# Patient Record
Sex: Female | Born: 1975 | Race: Black or African American | Hispanic: No | Marital: Single | Smoking: Current every day smoker
Health system: Southern US, Community
[De-identification: ages and names within clinical notes are randomized; demographics above are authoritative.]

## PROBLEM LIST (undated history)

## (undated) DIAGNOSIS — Z789 Other specified health status: Secondary | ICD-10-CM

## (undated) DIAGNOSIS — Z720 Tobacco use: Secondary | ICD-10-CM

## (undated) DIAGNOSIS — Z6833 Body mass index (BMI) 33.0-33.9, adult: Secondary | ICD-10-CM

## (undated) HISTORY — DX: Tobacco use: Z72.0

## (undated) HISTORY — PX: NO PAST SURGERIES: SHX2092

## (undated) HISTORY — DX: Body mass index (BMI) 33.0-33.9, adult: Z68.33

---

## 1999-09-10 ENCOUNTER — Encounter: Admission: RE | Admit: 1999-09-10 | Discharge: 1999-09-10 | Payer: Self-pay | Admitting: Obstetrics

## 2000-06-07 ENCOUNTER — Other Ambulatory Visit: Admission: RE | Admit: 2000-06-07 | Discharge: 2000-06-07 | Payer: Self-pay | Admitting: Obstetrics and Gynecology

## 2000-06-27 ENCOUNTER — Encounter (INDEPENDENT_AMBULATORY_CARE_PROVIDER_SITE_OTHER): Payer: Self-pay

## 2000-06-27 ENCOUNTER — Ambulatory Visit (HOSPITAL_COMMUNITY): Admission: RE | Admit: 2000-06-27 | Discharge: 2000-06-27 | Payer: Self-pay | Admitting: Obstetrics and Gynecology

## 2004-02-17 ENCOUNTER — Ambulatory Visit: Payer: Self-pay | Admitting: Family Medicine

## 2004-02-18 ENCOUNTER — Ambulatory Visit: Payer: Self-pay | Admitting: Family Medicine

## 2005-02-03 ENCOUNTER — Ambulatory Visit: Payer: Self-pay | Admitting: Internal Medicine

## 2007-04-12 ENCOUNTER — Emergency Department (HOSPITAL_COMMUNITY): Admission: EM | Admit: 2007-04-12 | Discharge: 2007-04-12 | Payer: Self-pay | Admitting: Emergency Medicine

## 2010-07-31 NOTE — Op Note (Signed)
[facility]  Patient:    Tara Warner, Tara Warner                      MRN: [id number] Proc. Date: 06/27/00 Attending:  Katherine Roan, M.D.                           Operative Report  PREOPERATIVE DIAGNOSIS:  Vulvar mass.  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  PROCEDURE PERFORMED:  Excision of vulvar mass.  SURGEON:  Katherine Roan, M.D.  DESCRIPTION OF PROCEDURE:  The patient was placed in the lithotomy position, prepped, and draped in the usual fashion.  An incision was made over the vulvar mass on the vaginal aspect of the mass and the cyst was dissected free. Hemostasis was meticulously obtained.  This was with 4-0 Vicryl and the Bovie. The base of the cyst was identified.  It appeared to have a solid portion of the mass.  This was completely excised and hemostasis around the base was accomplished with 4-0 nylon.  The vulva was then approximated and the skin closed with a subcuticular 3-0 chromic.  The bladder was emptied with about 20 cc of urine.  The incision was irrigated with copious antibiotic solution. [name] tolerated this procedure well and was sent to the recovery room in stable condition. DD:  06/27/00 TD:  06/27/00 Job: [id number]

## 2012-02-26 ENCOUNTER — Encounter (HOSPITAL_COMMUNITY): Payer: Self-pay | Admitting: *Deleted

## 2012-02-26 ENCOUNTER — Inpatient Hospital Stay (HOSPITAL_COMMUNITY)
Admission: AD | Admit: 2012-02-26 | Discharge: 2012-02-26 | Disposition: A | Discharge status: Home / Self Care | Payer: Medicaid Other | Source: Ambulatory Visit | Attending: Obstetrics and Gynecology | Admitting: Obstetrics and Gynecology

## 2012-02-26 DIAGNOSIS — L0233 Carbuncle of buttock: Secondary | ICD-10-CM | POA: Insufficient documentation

## 2012-02-26 DIAGNOSIS — L0232 Furuncle of buttock: Secondary | ICD-10-CM

## 2012-02-26 HISTORY — DX: Other specified health status: Z78.9

## 2012-02-26 MED ORDER — OXYCODONE-ACETAMINOPHEN 5-325 MG PO TABS: 1.0000 | ORAL_TABLET | ORAL | Status: DC | PRN

## 2012-02-26 NOTE — MAU Note (Signed)
First noted a couple days ago, started hurting yesterday. Has done a soak once. Has had them in the past, on butt cheeks and in the middle.

## 2012-02-26 NOTE — MAU Provider Note (Signed)
Chief Complaint: Abscess   First Provider Initiated Contact with Patient 02/26/12 0857     SUBJECTIVE HPI: Tara Warner is a 36 y.o. G1P1001 who presents with a painful mass on her right buttock x a couple days. Soaked once. No drainage. Has had a few boils in that area in the past, but has not sought Tx. Denies fever, chills, drainage. Poor historian.   Past Medical History  Diagnosis Date  . No pertinent past medical history    OB History    Grav Para Term Preterm Abortions TAB SAB Ect Mult Living   1 1 1  0 0 0 0 0 0 1     # Outc Date GA Lbr Len/2nd Wgt Sex Del Anes PTL Lv   1 TRM 10/96    M SVD EPI No Yes     Past Surgical History  Procedure Date  . No past surgeries    History   Social History  . Marital Status: Single    Spouse Name: N/A    Number of Children: N/A  . Years of Education: N/A   Occupational History  . Not on file.   Social History Main Topics  . Smoking status: Current Every Day Smoker -- 1.0 packs/day for 15 years    Types: Cigarettes  . Smokeless tobacco: Never Used  . Alcohol Use: Yes  . Drug Use: No  . Sexually Active: Yes    Birth Control/ Protection: Condom   Other Topics Concern  . Not on file   Social History Narrative  . No narrative on file   No current facility-administered medications on file prior to encounter.   No current outpatient prescriptions on file prior to encounter.   No Known Allergies  ROS: Pertinent items in HPI  OBJECTIVE Blood pressure 107/47, pulse 92, temperature 98.5 F (36.9 C), temperature source Oral, resp. rate 18, height 5\' 2"  (1.575 m), weight 89.359 kg (197 lb), last menstrual period 02/14/2012. GENERAL: Well-developed, well-nourished female in mild distress.  HEENT: Normocephalic HEART: normal rate RESP: normal effort NEURO: Alert and oriented x 4.  Buttocks: 3 cm firm, slightly erythematous, very tender mass on right buttock, 3 cm from rectum. Pt extremely intolerant of exam. No drainage.    LAB RESULTS No results found for this or any previous visit (from the past 24 hour(s)).  IMAGING No results found.  MAU COURSE Discussed that boil is not drainable at this time. ABX usually not helpful. If these are recurrent, suggesting Hidradenitis Suppurativa she should see a PCP or Derm and I&D usually not recommended.   ASSESSMENT 1. Boil of buttock    PLAN Discharge home Frequent warm soaks and compresses will usually cause the boil to drain.  Follow-up Information    Schedule an appointment as soon as possible for a visit with [facility].   Contact information:   [address] [place] [address]       Follow up with [facility] ED. (for fever greater than 100.4)    Contact information:   [address] [place] [address]            Medication List     As of 02/26/2012  9:36 AM    TAKE these medications         oxyCODONE-acetaminophen 5-325 MG per tablet   Commonly known as: PERCOCET/ROXICET   Take 1-2 tablets by mouth every 4 (four) hours as needed.        [place], [place] 02/26/2012  9:09 AM

## 2012-02-26 NOTE — Discharge Instructions (Signed)
Abscess An abscess is an infected area that contains a collection of pus and debris.It can occur in almost any part of the body. An abscess is also known as a furuncle or boil. CAUSES  An abscess occurs when tissue gets infected. This can occur from blockage of oil or sweat glands, infection of hair follicles, or a minor injury to the skin. As the body tries to fight the infection, pus collects in the area and creates pressure under the skin. This pressure causes pain. People with weakened immune systems have difficulty fighting infections and get certain abscesses more often.  SYMPTOMS Usually an abscess develops on the skin and becomes a painful mass that is red, warm, and tender. If the abscess forms under the skin, you may feel a moveable soft area under the skin. Some abscesses break open (rupture) on their own, but some will continue to get worse without care. The infection can spread deeper into the body and eventually into the bloodstream, causing you to feel ill.  DIAGNOSIS  Your caregiver will take your medical history and perform a physical exam. A sample of fluid may also be taken from the abscess to determine what is causing your infection. TREATMENT  Your caregiver may prescribe antibiotic medicines to fight the infection. However, taking antibiotics alone usually does not cure an abscess. Your caregiver may need to make a small cut (incision) in the abscess to drain the pus. In some cases, gauze is packed into the abscess to reduce pain and to continue draining the area. HOME CARE INSTRUCTIONS   Only take over-the-counter or prescription medicines for pain, discomfort, or fever as directed by your caregiver.  If you were prescribed antibiotics, take them as directed. Finish them even if you start to feel better.  If gauze is used, follow your caregiver's directions for changing the gauze.  To avoid spreading the infection:  Keep your draining abscess covered with a  bandage.  Wash your hands well.  Do not share personal care items, towels, or whirlpools with others.  Avoid skin contact with others.  Keep your skin and clothes clean around the abscess.  Keep all follow-up appointments as directed by your caregiver. SEEK MEDICAL CARE IF:   You have increased pain, swelling, redness.  You have muscle aches, chills, or a general ill feeling.  You have a fever greater than 100.4. MAKE SURE YOU:   Understand these instructions.  Will watch your condition.  Will get help right away if you are not doing well or get worse. Document Released: 12/09/2004 Document Revised: 08/31/2011 Document Reviewed: 05/14/2011 [facility] Patient Information 2013 [place], [place].

## 2012-02-26 NOTE — MAU Note (Signed)
Boil between butt cheeks. Painful, burning sensation.  Painful to sit and walk.

## 2012-02-29 NOTE — MAU Provider Note (Signed)
Attestation of Attending Supervision of Advanced Practitioner (CNM/NP): Evaluation and management procedures were performed by the Advanced Practitioner under my supervision and collaboration.  I have reviewed the Advanced Practitioner's note and chart, and I agree with the management and plan.  Darleth Eustache 02/29/2012 5:58 PM   

## 2012-09-11 ENCOUNTER — Encounter (HOSPITAL_COMMUNITY): Payer: Self-pay | Admitting: *Deleted

## 2012-09-11 ENCOUNTER — Emergency Department (HOSPITAL_COMMUNITY)
Admission: EM | Admit: 2012-09-11 | Discharge: 2012-09-11 | Disposition: A | Discharge status: Home / Self Care | Payer: Medicaid Other | Attending: Emergency Medicine | Admitting: Emergency Medicine

## 2012-09-11 DIAGNOSIS — F17200 Nicotine dependence, unspecified, uncomplicated: Secondary | ICD-10-CM | POA: Insufficient documentation

## 2012-09-11 DIAGNOSIS — A6000 Herpesviral infection of urogenital system, unspecified: Secondary | ICD-10-CM | POA: Insufficient documentation

## 2012-09-11 MED ORDER — CLOTRIMAZOLE 1 % EX CREA: TOPICAL_CREAM | CUTANEOUS | Status: DC

## 2012-09-11 MED ORDER — ACYCLOVIR 400 MG PO TABS: 400.0000 mg | ORAL_TABLET | Freq: Three times a day (TID) | ORAL | Status: DC

## 2012-09-11 NOTE — ED Provider Notes (Signed)
   History    CSN: [id number] Arrival date & time 09/11/12  1332  First MD Initiated Contact with Patient 09/11/12 1344     Chief Complaint  Patient presents with  . Abscess   (Consider location/radiation/quality/duration/timing/severity/associated sxs/prior Treatment) HPI Comments: Patient presents with painful bumps around her rectum.  She thinks that it is a boil.  She states that the bumps have been present for the past week and are gradually worsening.  She states that the area is very painful.  She has not tried any treatment prior to arrival.  She has not noticed any drainage from the area.  She denies fever or chills.  She reports that she is sexually active, but denies anal sex.  She does not use protection.  She denies any vaginal discharge.  Denies prior history of DM.  The history is provided by the patient.   Past Medical History  Diagnosis Date  . No pertinent past medical history    Past Surgical History  Procedure Laterality Date  . No past surgeries     Family History  Problem Relation Age of Onset  . Other Neg Hx    History  Substance Use Topics  . Smoking status: Current Every Day Smoker -- 1.00 packs/day for 15 years    Types: Cigarettes  . Smokeless tobacco: Never Used  . Alcohol Use: Yes   OB History   Grav Para Term Preterm Abortions TAB SAB Ect Mult Living   1 1 1  0 0 0 0 0 0 1     Review of Systems  Skin: Positive for rash.  All other systems reviewed and are negative.    Allergies  Review of patient's allergies indicates no known allergies.  Home Medications  No current outpatient prescriptions on file. BP 145/70  Pulse 103  Temp(Src) 98.2 F (36.8 C) (Oral)  Resp 18  Ht 5\' 1"  (1.549 m)  SpO2 99%  LMP 08/27/2012 Physical Exam  Nursing note and vitals reviewed. Constitutional: She appears well-developed and well-nourished. No distress.  HENT:  Head: Normocephalic and atraumatic.  Cardiovascular: Normal rate, regular rhythm and  normal heart sounds.   Pulmonary/Chest: Effort normal and breath sounds normal.  Genitourinary:  Several erythematous painful open lesions around the anus    Neurological: She is alert.  Skin: Lesion noted. She is not diaphoretic.  Psychiatric: She has a normal mood and affect.    ED Course  Procedures (including critical care time) Labs Reviewed - No data to display No results found. No diagnosis found.  MDM  Patient presenting with a perianal lesions.  Appearance most consistent with Genital Herpes.  Patient instructed to follow up for additional STD testing.  Patient treated with Acyclovir.    Pascal Lux [place], PA-C 09/13/12 1339

## 2012-09-11 NOTE — ED Notes (Signed)
Pt c/o abscess to buttocks x's 1 week. Reports hx of same.

## 2012-09-11 NOTE — Progress Notes (Signed)
During [facility] ED 09/11/12 visit pt informed ED CM she did not have a pcp, did not see a doctor and her medicaid coverage is still active (She was speaking with someone on her cell phone continuously during the time Cm visited her and as ED RN attempting to review d/c instructions).   Noted on her Medicaid card that her listed medicaid pcp is [facility] center EPIC updated

## 2012-09-14 ENCOUNTER — Emergency Department (HOSPITAL_COMMUNITY)
Admission: EM | Admit: 2012-09-14 | Discharge: 2012-09-14 | Disposition: A | Discharge status: Home / Self Care | Payer: Medicaid Other | Attending: Emergency Medicine | Admitting: Emergency Medicine

## 2012-09-14 ENCOUNTER — Encounter (HOSPITAL_COMMUNITY): Payer: Self-pay | Admitting: *Deleted

## 2012-09-14 ENCOUNTER — Emergency Department (HOSPITAL_COMMUNITY): Payer: Medicaid Other

## 2012-09-14 DIAGNOSIS — L0390 Cellulitis, unspecified: Secondary | ICD-10-CM

## 2012-09-14 DIAGNOSIS — K612 Anorectal abscess: Secondary | ICD-10-CM | POA: Insufficient documentation

## 2012-09-14 DIAGNOSIS — R21 Rash and other nonspecific skin eruption: Secondary | ICD-10-CM | POA: Insufficient documentation

## 2012-09-14 DIAGNOSIS — F17200 Nicotine dependence, unspecified, uncomplicated: Secondary | ICD-10-CM | POA: Insufficient documentation

## 2012-09-14 LAB — BASIC METABOLIC PANEL
BUN: 8 mg/dL (ref 6–23)
CO2: 27 mEq/L (ref 19–32)
Calcium: 9.5 mg/dL (ref 8.4–10.5)
Chloride: 102 mEq/L (ref 96–112)
Creatinine, Ser: 0.9 mg/dL (ref 0.50–1.10)
GFR calc Af Amer: >90 mL/min (ref >90)
GFR calc non Af Amer: 80 mL/min — ABNORMAL LOW (ref >90)
Glucose, Bld: 97 mg/dL (ref 70–99)
Potassium: 3.7 mEq/L (ref 3.5–5.1)
Sodium: 138 mEq/L (ref 135–145)

## 2012-09-14 LAB — CBC
HCT: 40.4 % (ref 36.0–46.0)
Hemoglobin: 13.3 g/dL (ref 12.0–15.0)
RBC: 4.9 MIL/uL (ref 3.87–5.11)
WBC: 8.5 10*3/uL (ref 4.0–10.5)

## 2012-09-14 MED ORDER — IOHEXOL 300 MG/ML  SOLN
100.0000 mL | Freq: Once | INTRAMUSCULAR | Status: AC | PRN
  Administered 2012-09-14: 100 mL via INTRAVENOUS

## 2012-09-14 MED ORDER — CLINDAMYCIN HCL 300 MG PO CAPS: 300.0000 mg | ORAL_CAPSULE | Freq: Three times a day (TID) | ORAL | Status: DC

## 2012-09-14 MED ORDER — MORPHINE SULFATE 4 MG/ML IJ SOLN
6.0000 mg | Freq: Once | INTRAMUSCULAR | Status: AC
  Administered 2012-09-14: 6 mg via INTRAVENOUS
  Filled 2012-09-14: qty 2

## 2012-09-14 MED ORDER — HYDROCODONE-ACETAMINOPHEN 5-325 MG PO TABS: 1.0000 | ORAL_TABLET | Freq: Four times a day (QID) | ORAL | Status: DC | PRN

## 2012-09-14 MED ORDER — IOHEXOL 300 MG/ML  SOLN
50.0000 mL | Freq: Once | INTRAMUSCULAR | Status: AC | PRN
  Administered 2012-09-14: 50 mL via ORAL

## 2012-09-14 NOTE — ED Notes (Signed)
Pt felt a little light-headed after giving Morphine IV;  Requested that pt lie down and she refused.  Sat with pt until pt felt better.

## 2012-09-14 NOTE — ED Notes (Signed)
Pt escorted to discharge window. Verbalized understanding discharge instructions. In no acute distress.   

## 2012-09-14 NOTE — ED Provider Notes (Signed)
History    CSN: [id number] Arrival date & time 09/14/12  1316  First MD Initiated Contact with Patient 09/14/12 1328     Chief Complaint  Patient presents with  . Abscess  . Rash   The history is provided by the patient.   Patient reports worsening swelling and pain ever) anal region over the past 2-3 weeks.  She was diagnosed with a possible fungal infection/herpes and was placed on medications but does not seem to be improving.  She reports the area is becoming larger and more painful and has foul drainage coming from it.  She is very hesitant to allow me to examine this area given the severity of pain.  Her pain is worsened by movement and palpation of the region.  She's had chills without documented fever.  No nausea or vomiting.  No abdominal pain.   Past Medical History  Diagnosis Date  . No pertinent past medical history    Past Surgical History  Procedure Laterality Date  . No past surgeries     Family History  Problem Relation Age of Onset  . Other Neg Hx    History  Substance Use Topics  . Smoking status: Current Every Day Smoker -- 1.00 packs/day for 15 years    Types: Cigarettes  . Smokeless tobacco: Never Used  . Alcohol Use: Yes   OB History   Grav Para Term Preterm Abortions TAB SAB Ect Mult Living   1 1 1  0 0 0 0 0 0 1     Review of Systems  All other systems reviewed and are negative.    Allergies  Review of patient's allergies indicates no known allergies.  Home Medications   Current Outpatient Rx  Name  Route  Sig  Dispense  Refill  . acyclovir (ZOVIRAX) 400 MG tablet   Oral   Take 1 tablet (400 mg total) by mouth 3 (three) times daily.   30 tablet   0   . clotrimazole (LOTRIMIN) 1 % cream      Apply to affected area 2 times daily.  Use for 7 days   15 g   0    BP 111/70  Pulse 87  Temp(Src) 98.6 F (37 C) (Oral)  Resp 20  SpO2 100%  LMP 08/27/2012 Physical Exam  Nursing note and vitals reviewed. Constitutional: She is  oriented to person, place, and time. She appears well-developed and well-nourished. No distress.  HENT:  Head: Normocephalic and atraumatic.  Eyes: EOM are normal.  Neck: Normal range of motion.  Cardiovascular: Normal rate, regular rhythm and normal heart sounds.   Pulmonary/Chest: Effort normal and breath sounds normal.  Abdominal: Soft. She exhibits no distension. There is no tenderness.  Genitourinary:  Patient has what appears to be fluctuance and drainage with a likely abscess of her right perianal area and right gluteal fold.  No surrounding erythema.  Musculoskeletal: Normal range of motion.  Neurological: She is alert and oriented to person, place, and time.  Skin: Skin is warm and dry.  Psychiatric: She has a normal mood and affect. Judgment normal.    ED Course  Procedures Labs Reviewed  BASIC METABOLIC PANEL - Abnormal; Notable for the following:    GFR calc non Af Amer 80 (*)    All other components within normal limits  CBC  HCG, SERUM, QUALITATIVE   No results found. No diagnosis found.  MDM  Patient will undergo CT scan to further evaluate depth and size of abscess  to determine appropriate care.  Given the location of the patient's hesitance ED even have me examine this is may benefit from operative incision and drainage in OR setting. Care to Dr Rhunette Croft, MD  Lyanne Co, MD 09/14/12 [phone]

## 2012-09-14 NOTE — ED Notes (Signed)
Pt c/o pain to buttocks. Was seen recently for same and diagnosed with fungal infection/herpes? Pt reports "I need something stronger."

## 2012-09-14 NOTE — ED Provider Notes (Signed)
I was the primary provider of this patient during this ER visit. The patients care was initiated by my PA, I will continue to the care.    Lyanne Co, MD 09/14/12 [phone]

## 2012-09-14 NOTE — ED Notes (Signed)
MD at bedside. 

## 2012-09-14 NOTE — ED Provider Notes (Signed)
  Physical Exam  BP 111/70  Pulse 87  Temp(Src) 98.6 F (37 C) (Oral)  Resp 20  SpO2 100%  LMP 08/27/2012  Physical Exam  ED Course  Procedures  MDM Assuming care from Dr. Patria Mane. Pt has some perirectal discomfort. Dr. Patria Mane had ordered a Ct to r/o abscess. CT shows no abscess, just inflammation, possible infection. Will treat with clinda, as this might be a phlegmon type situation.   Derwood Kaplan, MD 09/14/12 1749

## 2012-09-14 NOTE — ED Provider Notes (Signed)
MSE was initiated and I personally evaluated the patient and placed orders (if any) at  2:15 PM on September 14, 2012.  The patient appears stable so that the remainder of the MSE may be completed by another provider.  S: Pt c/o increased pain at buttocks with rash and draining lesions for past 2-3 weeks. Tx with acyclivir w/o relief. Denies fever, n/v/d.  O: pt appears uncomfortably, swaying back and forth standing up. Area of induration, erythema and warmth with scant drainage of serous anginous fluid. exquisitely tender to palpation.  A: possible complex abscess, may require general surgery or IV antibiotics P: obtain CT scan, blood work, start IV antibiotics and/or consult general surgery as needed based on blood work and CT scan. Pt is to be moved from FT area to acute care.   Discussed pt with Dr. Patria Mane who agrees with assessment and plan.    Junius Finner, PA-C 09/14/12 1415

## 2012-09-22 NOTE — ED Provider Notes (Signed)
Medical screening examination/treatment/procedure(s) were performed by non-physician practitioner and as supervising physician I was immediately available for consultation/collaboration.   Demya Scruggs R Lukus Binion, MD 09/22/12 1126 

## 2014-01-14 ENCOUNTER — Encounter (HOSPITAL_COMMUNITY): Payer: Self-pay | Admitting: *Deleted

## 2014-12-29 IMAGING — CT CT ABD-PELV W/ CM
1 of 2 series · 15 of 32 positions shown, 19 images · IV contrast (OMNIPAQUE 300)
Comparison: None.

CLINICAL DATA: Perianal and gluteal abscesses.

CT ABDOMEN AND PELVIS WITH CONTRAST
TECHNIQUE: Multidetector CT imaging of the abdomen and pelvis was
performed following the standard protocol during bolus
administration of intravenous contrast.
Contrast: 50mL OMNIPAQUE IOHEXOL 300 MG/ML  SOLN, 100mL OMNIPAQUE
IOHEXOL 300 MG/ML  SOLN

[Series 2: abd/pel with · axial · 0.84mm/px · z∈[+1090,+1540]mm · 15 of 98 slices shown, 19 images]
[im 4/98  soft-tissue]
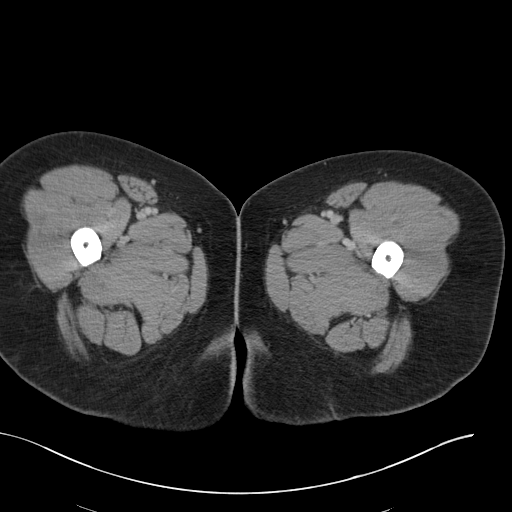
[im 4/98  bone]
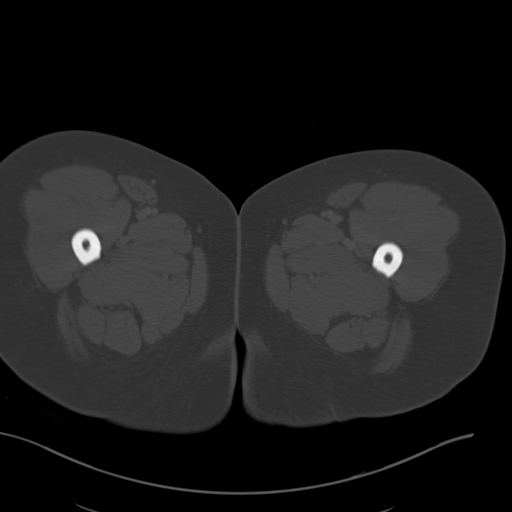
[im 12/98  soft-tissue]
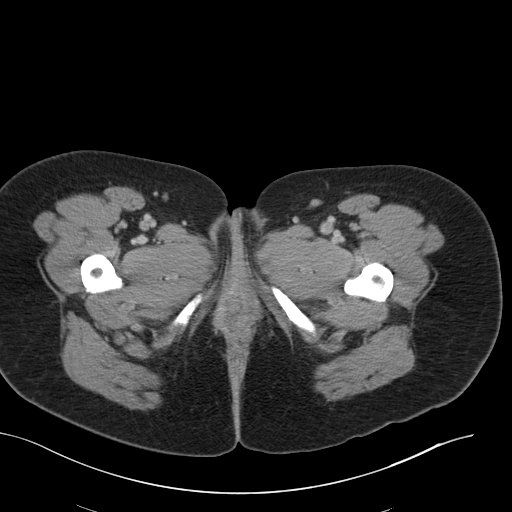
[im 19/98  soft-tissue]
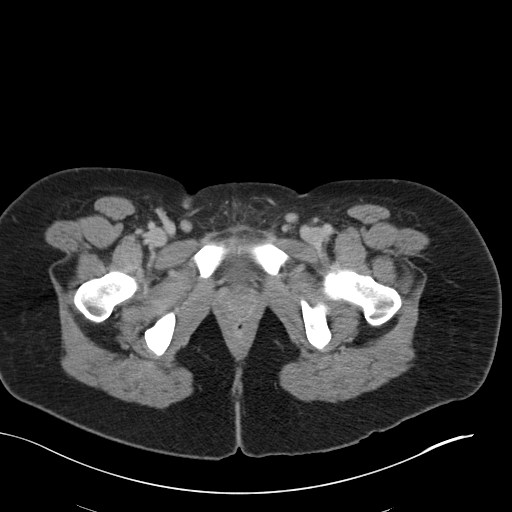
[im 27/98  soft-tissue]
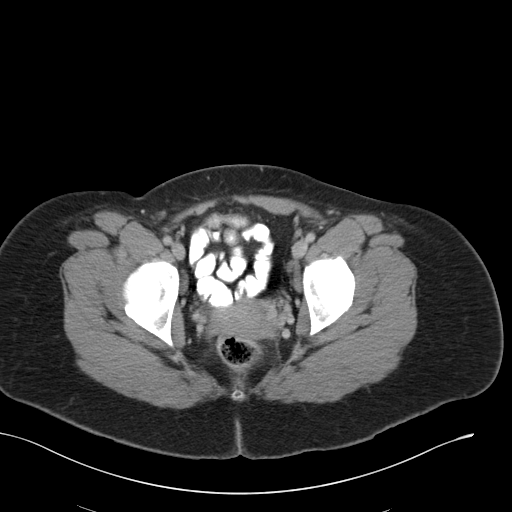
[im 34/98  soft-tissue]
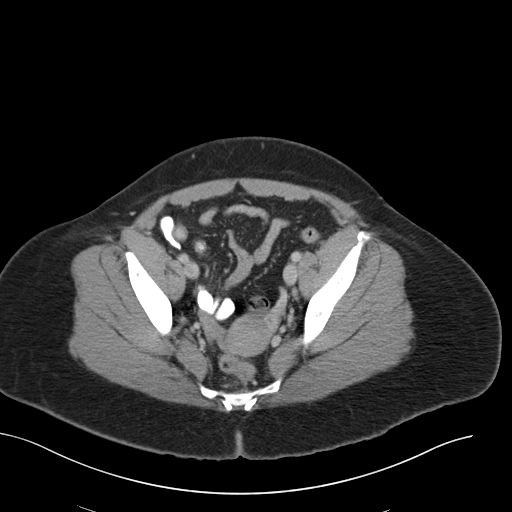
[im 42/98  soft-tissue]
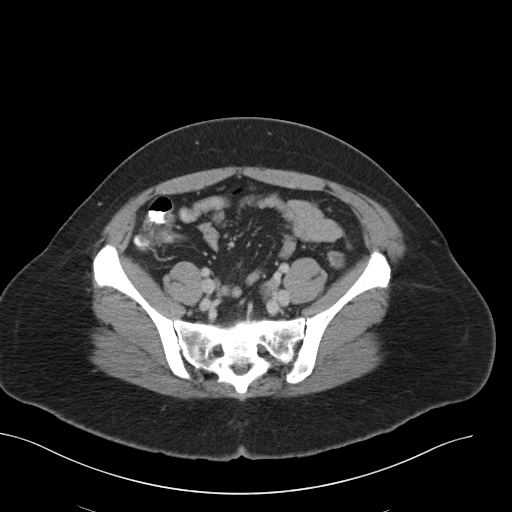
[im 49/98  soft-tissue]
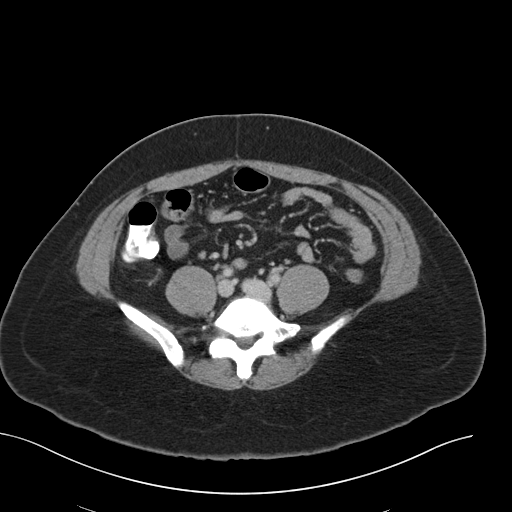
[im 56/98  soft-tissue]
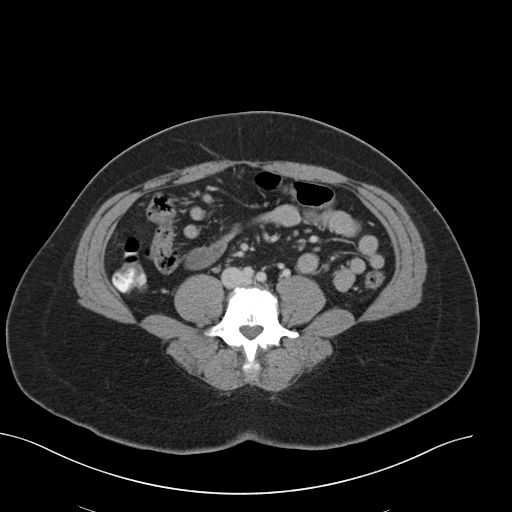
[im 64/98  soft-tissue]
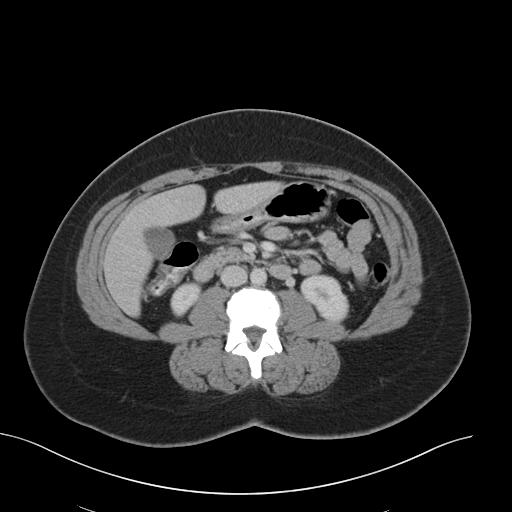
[im 64/98  bone]
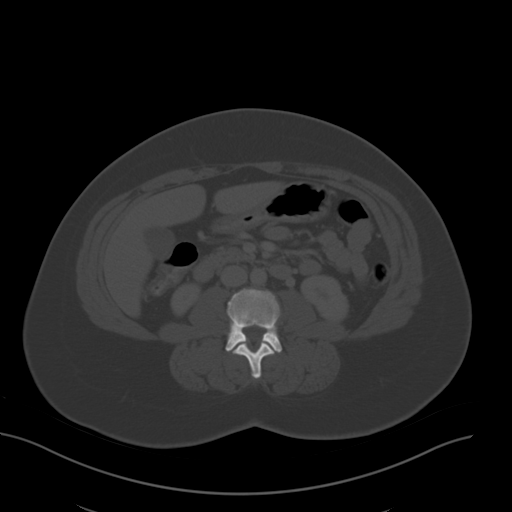
[im 71/98  soft-tissue]
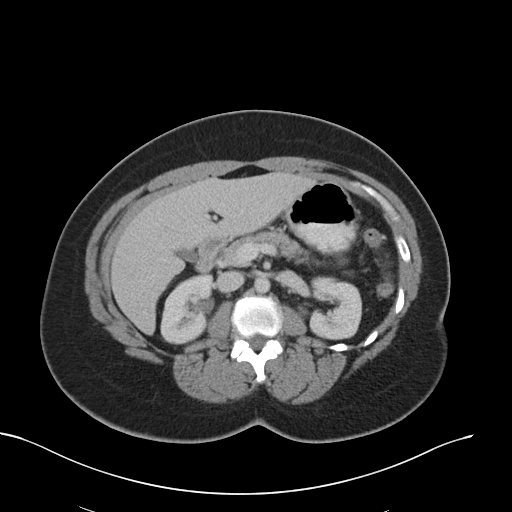
[im 79/98  soft-tissue]
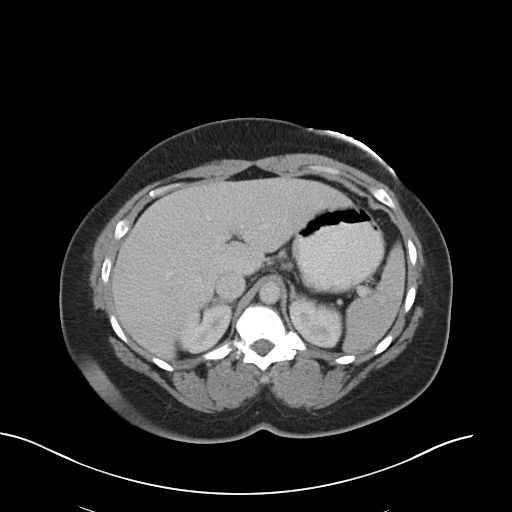
[im 83/98  lung]
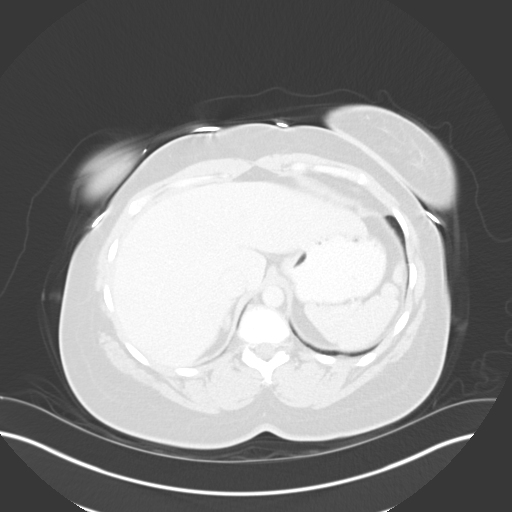
[im 86/98  soft-tissue]
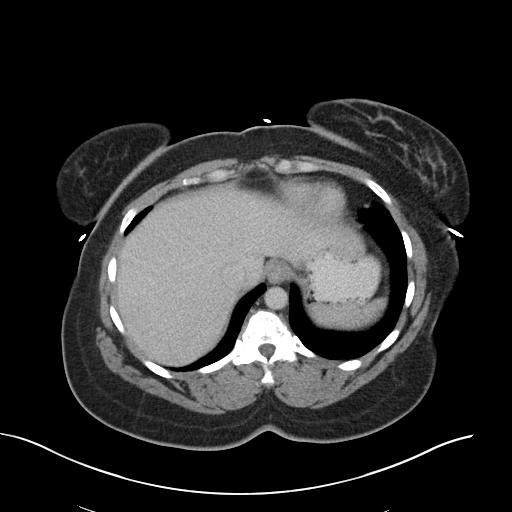
[im 86/98  lung]
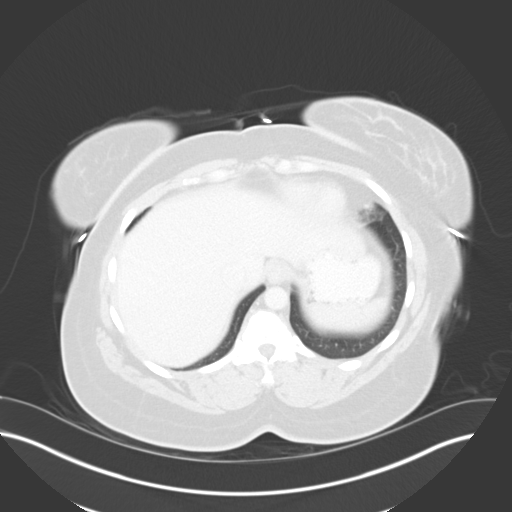
[im 90/98  lung]
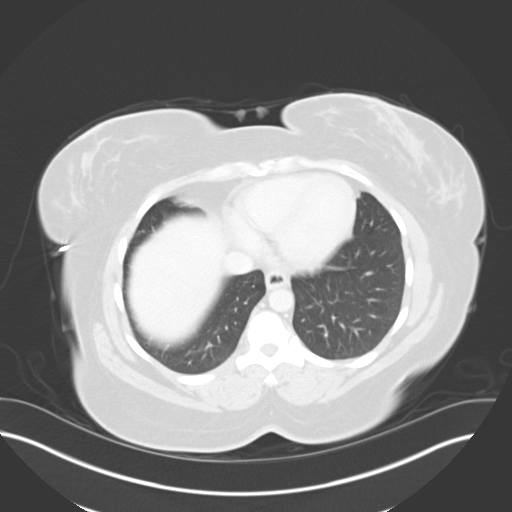
[im 94/98  soft-tissue]
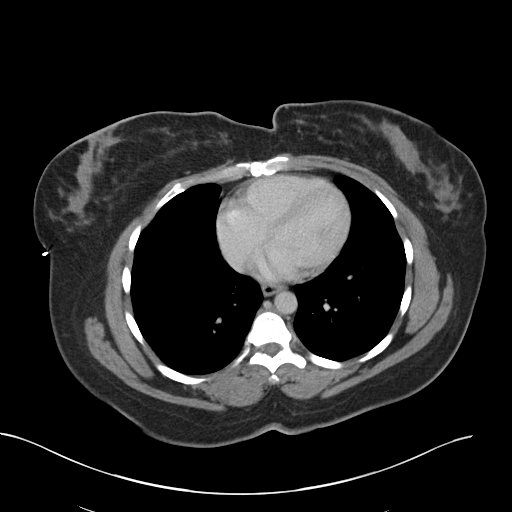
[im 94/98  lung]
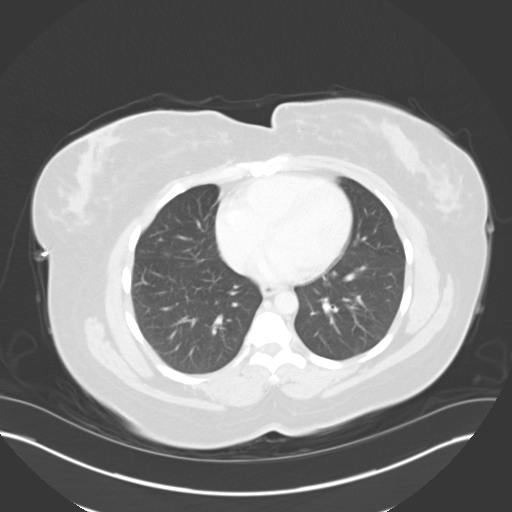

[15 of 32 positions shown; findings below may reference images not displayed]

FINDINGS: There is a small area of ill-defined lucency in the right
side of the buttock crease adjacent to the anus.  This could
represent a tiny abscess, no more than 15 mm in size.

The intrapelvic structures are normal including the uterus,
ovaries, and bowel.

The liver, biliary tree, spleen, pancreas, adrenal glands, and
kidneys are normal.  The bowel is normal including the terminal
ileum and appendix.  No adenopathy.  No osseous abnormality.
IMPRESSION: Subtle asymmetry of the soft tissues in the right side of the
buttock crease adjacent to the anus which may may be inflammatory.
No well-defined abscess.

## 2016-06-21 ENCOUNTER — Emergency Department (HOSPITAL_COMMUNITY)
Admission: EM | Admit: 2016-06-21 | Discharge: 2016-06-21 | Disposition: A | Discharge status: Home / Self Care | Payer: Medicaid Other | Attending: Emergency Medicine | Admitting: Emergency Medicine

## 2016-06-21 ENCOUNTER — Encounter (HOSPITAL_COMMUNITY): Payer: Self-pay

## 2016-06-21 DIAGNOSIS — F17210 Nicotine dependence, cigarettes, uncomplicated: Secondary | ICD-10-CM | POA: Diagnosis not present

## 2016-06-21 DIAGNOSIS — Z79899 Other long term (current) drug therapy: Secondary | ICD-10-CM | POA: Insufficient documentation

## 2016-06-21 DIAGNOSIS — R112 Nausea with vomiting, unspecified: Secondary | ICD-10-CM | POA: Insufficient documentation

## 2016-06-21 DIAGNOSIS — R197 Diarrhea, unspecified: Secondary | ICD-10-CM | POA: Insufficient documentation

## 2016-06-21 DIAGNOSIS — R1010 Upper abdominal pain, unspecified: Secondary | ICD-10-CM | POA: Diagnosis present

## 2016-06-21 LAB — CBC WITH DIFFERENTIAL/PLATELET
Basophils Absolute: 0 10*3/uL (ref 0.0–0.1)
Basophils Relative: 0 %
Eosinophils Absolute: 0.1 10*3/uL (ref 0.0–0.7)
Eosinophils Relative: 2 %
HEMATOCRIT: 45 % (ref 36.0–46.0)
HEMOGLOBIN: 14.7 g/dL (ref 12.0–15.0)
LYMPHS PCT: 15 %
Lymphs Abs: 1.3 10*3/uL (ref 0.7–4.0)
MCH: 27.9 pg (ref 26.0–34.0)
MCHC: 32.7 g/dL (ref 30.0–36.0)
MCV: 85.6 fL (ref 78.0–100.0)
Monocytes Absolute: 0.5 10*3/uL (ref 0.1–1.0)
Monocytes Relative: 6 %
NEUTROS ABS: 6.4 10*3/uL (ref 1.7–7.7)
NEUTROS PCT: 77 %
Platelets: 200 10*3/uL (ref 150–400)
RBC: 5.26 MIL/uL — AB (ref 3.87–5.11)
RDW: 14.3 % (ref 11.5–15.5)
WBC: 8.3 10*3/uL (ref 4.0–10.5)

## 2016-06-21 LAB — URINALYSIS, ROUTINE W REFLEX MICROSCOPIC
Bilirubin Urine: NEGATIVE
Glucose, UA: NEGATIVE mg/dL
HGB URINE DIPSTICK: NEGATIVE
Ketones, ur: 20 mg/dL — AB
NITRITE: NEGATIVE
Protein, ur: NEGATIVE mg/dL
SPECIFIC GRAVITY, URINE: 1.027 (ref 1.005–1.030)
pH: 5 (ref 5.0–8.0)

## 2016-06-21 LAB — COMPREHENSIVE METABOLIC PANEL
ALK PHOS: 38 U/L (ref 38–126)
ALT: 19 U/L (ref 14–54)
AST: 22 U/L (ref 15–41)
Albumin: 3.3 g/dL — ABNORMAL LOW (ref 3.5–5.0)
Anion gap: 9 (ref 5–15)
BUN: 8 mg/dL (ref 6–20)
CALCIUM: 8.7 mg/dL — AB (ref 8.9–10.3)
CO2: 26 mmol/L (ref 22–32)
CREATININE: 0.96 mg/dL (ref 0.44–1.00)
Chloride: 103 mmol/L (ref 101–111)
Glucose, Bld: 97 mg/dL (ref 65–99)
Potassium: 3.5 mmol/L (ref 3.5–5.1)
Sodium: 138 mmol/L (ref 135–145)
Total Bilirubin: 0.9 mg/dL (ref 0.3–1.2)
Total Protein: 5.7 g/dL — ABNORMAL LOW (ref 6.5–8.1)

## 2016-06-21 LAB — LIPASE, BLOOD

## 2016-06-21 LAB — POC URINE PREG, ED: Preg Test, Ur: NEGATIVE

## 2016-06-21 MED ORDER — ONDANSETRON HCL 4 MG PO TABS: 4.0000 mg | ORAL_TABLET | Freq: Four times a day (QID) | ORAL | 0 refills | Status: DC

## 2016-06-21 MED ORDER — FAMOTIDINE 20 MG PO TABS
20.0000 mg | ORAL_TABLET | Freq: Two times a day (BID) | ORAL | 0 refills | Status: DC
Start: 2016-06-21 — End: 2018-01-05

## 2016-06-21 NOTE — ED Notes (Signed)
Called pt name x6 in lobby to be roomed. No response.

## 2016-06-21 NOTE — ED Provider Notes (Signed)
[facility]-EMERGENCY DEPT Provider Note   CSN: [id number] Arrival date & time: 06/21/16  1548   By signing my name below, I, Clovis Pu, attest that this documentation has been prepared under the direction and in the presence of  Kerrie Buffalo, NP. Electronically Signed: Clovis Pu, ED Scribe. 06/21/16. 5:52 PM.   History   Chief Complaint Chief Complaint  Patient presents with  . Abdominal Pain    HPI Comments:  Tara Warner is a 41 y.o. female who presents to the Emergency Department complaining of acute onset, intermittent, upper abdominal pain x yesterday. Pt also reports nausea, vomiting and diarrhea. She notes she ate baked chicken and a hamburger yesterday. She states she is sexually active without protection. No alleviating or aggravating factors noted. Pt denies fevers, chills, frequency, urgency, ear pain, sore throat or any other associated symptoms. She reports irregular menstrual periods. No other complaints noted at this time.    The history is provided by the patient. No language interpreter was used.  Abdominal Pain   This is a new problem. The current episode started yesterday. The problem occurs hourly. The problem has not changed since onset.The pain is associated with an unknown factor. The quality of the pain is burning. The pain is moderate. Associated symptoms include diarrhea, nausea, vomiting and myalgias. Pertinent negatives include fever, dysuria and frequency. Nothing aggravates the symptoms. Nothing relieves the symptoms.    Past Medical History:  Diagnosis Date  . No pertinent past medical history     There are no active problems to display for this patient.   Past Surgical History:  Procedure Laterality Date  . NO PAST SURGERIES      OB History    Gravida Para Term Preterm AB Living   0 0 1   SAB TAB Ectopic Multiple Live Births   0 0 0 0 1       Home Medications    Prior to Admission medications   Medication Sig Start Date End  Date Taking? Authorizing Provider  acyclovir (ZOVIRAX) 400 MG tablet Take 1 tablet (400 mg total) by mouth 3 (three) times daily. 09/11/12   Heather Laisure, PA-C  clotrimazole (LOTRIMIN) 1 % cream Apply to affected area 2 times daily.  Use for 7 days 09/11/12   Santiago Glad, PA-C  famotidine (PEPCID) 20 MG tablet Take 1 tablet (20 mg total) by mouth 2 (two) times daily. 06/21/16   Hope Orlene Och, NP  ondansetron (ZOFRAN) 4 MG tablet Take 1 tablet (4 mg total) by mouth every 6 (six) hours. 06/21/16   Hope Orlene Och, NP    Family History Family History  Problem Relation Age of Onset  . Other Neg Hx     Social History Social History  Substance Use Topics  . Smoking status: Current Every Day Smoker    Packs/day: 1.00    Years: 15.00    Types: Cigarettes  . Smokeless tobacco: Never Used  . Alcohol use Yes     Allergies   Patient has no known allergies.   Review of Systems Review of Systems  Constitutional: Negative for chills and fever.  HENT: Negative for ear pain and sore throat.   Respiratory: Negative for cough.   Gastrointestinal: Positive for abdominal pain, diarrhea, nausea and vomiting.  Genitourinary: Negative for dysuria, frequency and urgency.  Musculoskeletal: Positive for myalgias.  Skin: Negative for rash.     Physical Exam Updated Vital Signs BP 126/74 (BP Location: Right Arm)  Pulse 69   Temp 98 F (36.7 C) (Oral)   Resp 18   Ht  (1.549 m)   Wt 79.4 kg   LMP  (LMP Unknown)   SpO2 100%   BMI 33.07 kg/m   Physical Exam  Constitutional: She is oriented to person, place, and time. She appears well-developed and well-nourished. No distress.  HENT:  Head: Normocephalic and atraumatic.  Eyes: Conjunctivae are normal.  Cardiovascular: Normal rate.   Pulmonary/Chest: Effort normal.  Abdominal: She exhibits no distension. There is tenderness (mild) in the epigastric area.  Neurological: She is alert and oriented to person, place, and time.  Skin: Skin  is warm and dry.  Psychiatric: She has a normal mood and affect.  Nursing note and vitals reviewed.    ED Treatments / Results  DIAGNOSTIC STUDIES:  Oxygen Saturation is 100% on RA, normal by my interpretation.    COORDINATION OF CARE:  5:50 PM Discussed treatment plan with pt at bedside and pt agreed to plan.  Labs (all labs ordered are listed, but only abnormal results are displayed) Labs Reviewed  URINALYSIS, ROUTINE W REFLEX MICROSCOPIC - Abnormal; Notable for the following:       Result Value   APPearance HAZY (*)    Ketones, ur 20 (*)    Leukocytes, UA SMALL (*)    Bacteria, UA RARE (*)    Squamous Epithelial / LPF 0-5 (*)    All other components within normal limits  CBC WITH DIFFERENTIAL/PLATELET - Abnormal; Notable for the following:    RBC 5.26 (*)    All other components within normal limits  COMPREHENSIVE METABOLIC PANEL - Abnormal; Notable for the following:    Calcium 8.7 (*)    Total Protein 5.7 (*)    Albumin 3.3 (*)    All other components within normal limits  LIPASE, BLOOD - Abnormal; Notable for the following:    Lipase <10 (*)    All other components within normal limits  POC URINE PREG, ED   Radiology No results found.  Procedures Procedures (including critical care time)  Medications Ordered in ED Medications - No data to display   Initial Impression / Assessment and Plan / ED Course  I have reviewed the triage vital signs and the nursing notes.  Pertinent labs & imaging results that were available during my care of the patient were reviewed by me and considered in my medical decision making (see chart for details).   Final Clinical Impressions(s) / ED Diagnoses  41 y.o. female with n/v/d stable for d/c without signs of dehydration and does not appear toxic. Will treat with Zofran for the nausea and Pepcid for the epigastric burning. Discussed with the patient and all questioned fully answered. She will call me if any problems arise.    Final diagnoses:  Nausea vomiting and diarrhea    New Prescriptions New Prescriptions   FAMOTIDINE (PEPCID) 20 MG TABLET    Take 1 tablet (20 mg total) by mouth 2 (two) times daily.   ONDANSETRON (ZOFRAN) 4 MG TABLET    Take 1 tablet (4 mg total) by mouth every 6 (six) hours.   I personally performed the services described in this documentation, which was scribed in my presence. The recorded information has been reviewed and is accurate.     [place], [place] 06/21/16 2007    Margarita Grizzle, MD 06/21/16 2053

## 2016-06-21 NOTE — ED Notes (Signed)
Pt verbalized understanding discharge instructions and denies any further needs or questions at this time. VS stable, ambulatory and steady gait.   

## 2016-06-21 NOTE — ED Triage Notes (Signed)
Pt states she had some vomiting and abdominal pain yesterday morning and hasn't been feeling well. She reports she wants a pregnancy test. No pain at this time

## 2018-01-05 ENCOUNTER — Emergency Department (HOSPITAL_COMMUNITY)
Admission: EM | Admit: 2018-01-05 | Discharge: 2018-01-05 | Disposition: A | Discharge status: Home / Self Care | Payer: Self-pay | Attending: Emergency Medicine | Admitting: Emergency Medicine

## 2018-01-05 ENCOUNTER — Other Ambulatory Visit: Payer: Self-pay

## 2018-01-05 ENCOUNTER — Emergency Department (HOSPITAL_COMMUNITY): Payer: Self-pay

## 2018-01-05 ENCOUNTER — Encounter (HOSPITAL_COMMUNITY): Payer: Self-pay | Admitting: *Deleted

## 2018-01-05 DIAGNOSIS — K5900 Constipation, unspecified: Secondary | ICD-10-CM

## 2018-01-05 DIAGNOSIS — N390 Urinary tract infection, site not specified: Secondary | ICD-10-CM | POA: Insufficient documentation

## 2018-01-05 DIAGNOSIS — K5641 Fecal impaction: Secondary | ICD-10-CM | POA: Insufficient documentation

## 2018-01-05 DIAGNOSIS — F17210 Nicotine dependence, cigarettes, uncomplicated: Secondary | ICD-10-CM | POA: Insufficient documentation

## 2018-01-05 LAB — CBC WITH DIFFERENTIAL/PLATELET
ABS IMMATURE GRANULOCYTES: 0.13 10*3/uL — AB (ref 0.00–0.07)
BASOS ABS: 0 10*3/uL (ref 0.0–0.1)
Basophils Relative: 0 %
Eosinophils Absolute: 0.1 10*3/uL (ref 0.0–0.5)
Eosinophils Relative: 1 %
HCT: 47.1 % — ABNORMAL HIGH (ref 36.0–46.0)
HEMOGLOBIN: 14.6 g/dL (ref 12.0–15.0)
Immature Granulocytes: 1 %
LYMPHS ABS: 1.7 10*3/uL (ref 0.7–4.0)
LYMPHS PCT: 14 %
MCH: 26.4 pg (ref 26.0–34.0)
MCHC: 31 g/dL (ref 30.0–36.0)
MCV: 85.2 fL (ref 80.0–100.0)
MONO ABS: 0.7 10*3/uL (ref 0.1–1.0)
Monocytes Relative: 5 %
NEUTROS ABS: 9.5 10*3/uL — AB (ref 1.7–7.7)
NRBC: 0 % (ref 0.0–0.2)
Neutrophils Relative %: 79 %
Platelets: 239 10*3/uL (ref 150–400)
RBC: 5.53 MIL/uL — ABNORMAL HIGH (ref 3.87–5.11)
RDW: 14.1 % (ref 11.5–15.5)
WBC: 12.1 10*3/uL — AB (ref 4.0–10.5)

## 2018-01-05 LAB — URINALYSIS, ROUTINE W REFLEX MICROSCOPIC
Glucose, UA: NEGATIVE mg/dL
Hgb urine dipstick: NEGATIVE
Ketones, ur: 20 mg/dL — AB
NITRITE: NEGATIVE
Protein, ur: 30 mg/dL — AB
Specific Gravity, Urine: 1.03 (ref 1.005–1.030)
pH: 5 (ref 5.0–8.0)

## 2018-01-05 LAB — COMPREHENSIVE METABOLIC PANEL
ALBUMIN: 4.6 g/dL (ref 3.5–5.0)
ALK PHOS: 50 U/L (ref 38–126)
ALT: 20 U/L (ref 0–44)
AST: 21 U/L (ref 15–41)
Anion gap: 11 (ref 5–15)
BUN: 11 mg/dL (ref 6–20)
CALCIUM: 9.4 mg/dL (ref 8.9–10.3)
CO2: 24 mmol/L (ref 22–32)
CREATININE: 0.98 mg/dL (ref 0.44–1.00)
Chloride: 105 mmol/L (ref 98–111)
GFR calc Af Amer: >60 mL/min (ref >60)
GFR calc non Af Amer: >60 mL/min (ref >60)
GLUCOSE: 86 mg/dL (ref 70–99)
Potassium: 3.6 mmol/L (ref 3.5–5.1)
Sodium: 140 mmol/L (ref 135–145)
Total Bilirubin: 0.9 mg/dL (ref 0.3–1.2)
Total Protein: 7.9 g/dL (ref 6.5–8.1)

## 2018-01-05 LAB — PREGNANCY, URINE: PREG TEST UR: NEGATIVE

## 2018-01-05 LAB — LIPASE, BLOOD: Lipase: 18 U/L (ref 11–51)

## 2018-01-05 MED ORDER — LIDOCAINE HCL URETHRAL/MUCOSAL 2 % EX GEL
1.0000 "application " | Freq: Once | CUTANEOUS | Status: AC
  Administered 2018-01-05: 1
  Filled 2018-01-05: qty 5

## 2018-01-05 MED ORDER — CEPHALEXIN 500 MG PO CAPS: ORAL_CAPSULE | ORAL | 0 refills | Status: AC

## 2018-01-05 MED ORDER — SODIUM CHLORIDE 0.9 % IV SOLN
1.0000 g | Freq: Once | INTRAVENOUS | Status: AC
  Administered 2018-01-05: 1 g via INTRAVENOUS
  Filled 2018-01-05: qty 10

## 2018-01-05 MED ORDER — POLYETHYLENE GLYCOL 3350 17 G PO PACK: 17.0000 g | PACK | Freq: Every day | ORAL | 0 refills | Status: AC

## 2018-01-05 MED ORDER — FENTANYL CITRATE (PF) 100 MCG/2ML IJ SOLN
50.0000 ug | Freq: Once | INTRAMUSCULAR | Status: AC
  Administered 2018-01-05: 50 ug via INTRAVENOUS
  Filled 2018-01-05: qty 2

## 2018-01-05 MED ORDER — IBUPROFEN 800 MG PO TABS
800.0000 mg | ORAL_TABLET | Freq: Once | ORAL | Status: AC
  Administered 2018-01-05: 800 mg via ORAL
  Filled 2018-01-05: qty 1

## 2018-01-05 MED ORDER — SORBITOL 70 % SOLN
960.0000 mL | TOPICAL_OIL | Freq: Once | ORAL | Status: AC
  Administered 2018-01-05: 960 mL via RECTAL
  Filled 2018-01-05: qty 473

## 2018-01-05 NOTE — ED Triage Notes (Signed)
Pt complains of diarrhea and rectal pain. Pt states she feels she has stool in rectum causing the pain.

## 2018-01-05 NOTE — ED Notes (Signed)
EDPA Provider at bedside. 

## 2018-01-05 NOTE — ED Notes (Signed)
Patient transported to X-ray 

## 2018-01-05 NOTE — ED Notes (Signed)
ED Provider at bedside. 

## 2018-01-05 NOTE — ED Notes (Signed)
ED Provider is attempting to conduct 2nd rectal exam. ED Provider and RN were unsuccessful due to patient being in pain and moving.   ED Provider and RN were unsuccessful due to pain and patient moving around. We will attempt to medicate first.

## 2018-01-05 NOTE — ED Provider Notes (Signed)
[facility]-EMERGENCY DEPT Provider Note   CSN: [id number] Arrival date & time: 01/05/18  1233     History   Chief Complaint Chief Complaint  Patient presents with  . Diarrhea  . Rectal Pain    HPI Tara Warner is a 42 y.o. female.  The history is provided by the patient. No language interpreter was used.  Diarrhea       42 year old female presenting for evaluation of rectal discomfort.  Patient report for the past 2 weeks she has had intermittent pain in her rectum.  She felt as if she had a large ball of stool in her rectum that she cannot pushed out.  She did notice loose stools with each bowel movement but she is currently taking laxative and other over-the-counter medication to have move about.  Pain is specifically located in the rectum.  She denies any significant abdominal pain.  She denies having fever, chills, dysuria or vaginal discharge.  She denies being pregnant.  Her symptom is moderate to severe at this time.  Past Medical History:  Diagnosis Date  . No pertinent past medical history     There are no active problems to display for this patient.   Past Surgical History:  Procedure Laterality Date  . NO PAST SURGERIES       OB History    Gravida  1   Para  1   Term  1   Preterm  0   AB  0   Living  1     SAB  0   TAB  0   Ectopic  0   Multiple  0   Live Births  1            Home Medications    Prior to Admission medications   Medication Sig Start Date End Date Taking? Authorizing Provider  ibuprofen (ADVIL,MOTRIN) 100 MG/5ML suspension Take 200 mg by mouth every 4 (four) hours as needed for mild pain.   Yes [provider]  Ibuprofen-diphenhydrAMINE Cit (ADVIL PM) 200-38 MG TABS Take 2 tablets by mouth 2 (two) times daily as needed (pain).   Yes [provider]  acyclovir (ZOVIRAX) 400 MG tablet Take 1 tablet (400 mg total) by mouth 3 (three) times daily. Patient not taking: Reported  on 01/05/2018 09/11/12   Santiago Glad, PA-C  clotrimazole (LOTRIMIN) 1 % cream Apply to affected area 2 times daily.  Use for 7 days Patient not taking: Reported on 01/05/2018 09/11/12   Santiago Glad, PA-C  famotidine (PEPCID) 20 MG tablet Take 1 tablet (20 mg total) by mouth 2 (two) times daily. Patient not taking: Reported on 01/05/2018 06/21/16   Janne Napoleon, NP  ondansetron (ZOFRAN) 4 MG tablet Take 1 tablet (4 mg total) by mouth every 6 (six) hours. Patient not taking: Reported on 01/05/2018 06/21/16   Janne Napoleon, NP    Family History Family History  Problem Relation Age of Onset  . Other Neg Hx     Social History Social History   Tobacco Use  . Smoking status: Current Every Day Smoker    Packs/day: 1.00    Years: 15.00    Pack years: 15.00    Types: Cigarettes  . Smokeless tobacco: Never Used  Substance Use Topics  . Alcohol use: Yes  . Drug use: No     Allergies   Patient has no known allergies.   Review of Systems Review of Systems  Gastrointestinal: Positive for diarrhea.  All other systems reviewed and are negative.    Physical Exam Updated Vital Signs BP (!) 143/100   Pulse (!) 104   Temp 97.6 F (36.4 C)   Resp 17   SpO2 100%   Physical Exam  Constitutional: She appears well-developed and well-nourished. No distress.  HENT:  Head: Atraumatic.  Eyes: Conjunctivae are normal.  Neck: Neck supple.  Cardiovascular: Normal rate and regular rhythm.  Pulmonary/Chest: Effort normal and breath sounds normal.  Abdominal: Soft. Bowel sounds are normal. She exhibits no distension. There is no tenderness.  Genitourinary:  Genitourinary Comments: Chaperone present during exam.  Exam today is limited as patient exhibits significant discomfort even with gentle pressure to her buttock to visualize her rectum.  Moderate amount of stool noted around the perianal region.  Unable to appreciate any stool impaction, or anal fissure, rectal abscess, or obvious  mass.  Neurological: She is alert.  Skin: No rash noted.  Psychiatric: She has a normal mood and affect.  Nursing note and vitals reviewed.    ED Treatments / Results  Labs (all labs ordered are listed, but only abnormal results are displayed) Labs Reviewed  CBC WITH DIFFERENTIAL/PLATELET - Abnormal; Notable for the following components:      Result Value   WBC 12.1 (*)    RBC 5.53 (*)    HCT 47.1 (*)    Neutro Abs 9.5 (*)    Abs Immature Granulocytes 0.13 (*)    All other components within normal limits  URINALYSIS, ROUTINE W REFLEX MICROSCOPIC - Abnormal; Notable for the following components:   Color, Urine AMBER (*)    APPearance CLOUDY (*)    Bilirubin Urine SMALL (*)    Ketones, ur 20 (*)    Protein, ur 30 (*)    Leukocytes, UA SMALL (*)    Bacteria, UA MANY (*)    All other components within normal limits  COMPREHENSIVE METABOLIC PANEL  LIPASE, BLOOD  PREGNANCY, URINE    EKG None  Radiology Dg Abd Acute W/chest  Result Date: 01/05/2018 CLINICAL DATA:  Diarrhea and rectal pain EXAM: DG ABDOMEN ACUTE W/ 1V CHEST COMPARISON:  CT 10/11/2012 FINDINGS: Single-view chest demonstrates no focal opacity or pleural effusion. Normal heart size. No pneumothorax. Supine and upright views of the abdomen demonstrate no free air beneath the diaphragm. Nonobstructed gas pattern with large amount of stool in the colon. No radiopaque calculi. IMPRESSION: Negative abdominal radiographs. No acute cardiopulmonary disease. Large volume of stool in the colon. Electronically Signed   By: Jasmine Pang M.D.   On: 01/05/2018 20:16    Procedures Fecal disimpaction Date/Time: 01/05/2018 5:38 PM Performed by: Fayrene Helper, PA-C Authorized by: Fayrene Helper, PA-C  Consent: Verbal consent obtained. Risks and benefits: risks, benefits and alternatives were discussed Local anesthesia used: yes  Anesthesia: Local anesthesia used: yes Local Anesthetic: topical anesthetic  Sedation: Patient  sedated: no  Patient tolerance of procedure: patient tolerates with difficulty.    (including critical care time)  Medications Ordered in ED Medications  lidocaine (XYLOCAINE) 2 % jelly 1 application (1 application Other Given 01/05/18 1643)  ibuprofen (ADVIL,MOTRIN) tablet 800 mg (800 mg Oral Given 01/05/18 1616)  fentaNYL (SUBLIMAZE) injection 50 mcg (50 mcg Intravenous Given 01/05/18 1735)  sorbitol, milk of mag, mineral oil, glycerin (SMOG) enema (960 mLs Rectal Given 01/05/18 1759)  cefTRIAXone (ROCEPHIN) 1 g in sodium chloride 0.9 % 100 mL IVPB (1 g Intravenous New Bag/Given (Non-Interop) 01/05/18 1841)     Initial Impression / Assessment  and Plan / ED Course  I have reviewed the triage vital signs and the nursing notes.  Pertinent labs & imaging results that were available during my care of the patient were reviewed by me and considered in my medical decision making (see chart for details).     BP (!) 146/76 (BP Location: Left Arm)   Pulse 82   Temp 97.6 F (36.4 C)   Resp 18   Ht 5\' 1"  (1.549 m)   Wt 79.4 kg   SpO2 100%   BMI 33.07 kg/m    Final Clinical Impressions(s) / ED Diagnoses   Final diagnoses:  Constipation, unspecified constipation type  Acute UTI    ED Discharge Orders         Ordered    polyethylene glycol (MIRALAX / GLYCOLAX) packet  Daily     01/05/18 2038    cephALEXin (KEFLEX) 500 MG capsule     01/05/18 2038         3:26 PM Patient presents with proctalgia ongoing for the past 2 weeks.  Exam is limited initially as patient will not tolerate digital rectal exam.  Given her discomfort, will obtain abdominal pelvic CT scan to assess for potential perirectal abscess or obvious mass that may contribute to her symptoms.  4:54 PM On second examination, a large impacted fecal matter were noted at the rectum.  Initial attempt to manually disimpact was quite discomforting to pt.  Will provide pain medication and will attempt again.   5:39  PM I have performed manual disimpaction with successful loosening of impacted stool.  Will give SMOG enema for further resolution of her condition. No evidence of rectal mass, perirectal abscess or thrombosed hemorrhoid.   8:39 PM Pt report significant improvement of sxs after successful BM from SMOG enema.  Felt much better.  Pt d/c home with stool softener, abx for UTI and return precaution.     Fayrene Helper, PA-C 01/05/18 2039    Mancel Bale, MD 01/06/18 1009

## 2018-01-05 NOTE — Discharge Instructions (Signed)
Drink plenty of fluid.  Take stool softener.  Take antibiotic as prescribed.

## 2019-12-26 ENCOUNTER — Emergency Department (HOSPITAL_COMMUNITY)
Admission: EM | Admit: 2019-12-26 | Discharge: 2019-12-26 | Disposition: A | Discharge status: Home / Self Care | Payer: Medicaid Other | Attending: Emergency Medicine | Admitting: Emergency Medicine

## 2019-12-26 ENCOUNTER — Other Ambulatory Visit: Payer: Self-pay

## 2019-12-26 ENCOUNTER — Encounter (HOSPITAL_COMMUNITY): Payer: Self-pay | Admitting: Emergency Medicine

## 2019-12-26 DIAGNOSIS — U071 COVID-19: Secondary | ICD-10-CM | POA: Diagnosis not present

## 2019-12-26 DIAGNOSIS — F17210 Nicotine dependence, cigarettes, uncomplicated: Secondary | ICD-10-CM | POA: Diagnosis not present

## 2019-12-26 DIAGNOSIS — R439 Unspecified disturbances of smell and taste: Secondary | ICD-10-CM | POA: Diagnosis present

## 2019-12-26 DIAGNOSIS — R432 Parageusia: Secondary | ICD-10-CM

## 2019-12-26 LAB — RESPIRATORY PANEL BY RT PCR (FLU A&B, COVID)
Influenza A by PCR: NEGATIVE
Influenza B by PCR: NEGATIVE
SARS Coronavirus 2 by RT PCR: POSITIVE — AB

## 2019-12-26 NOTE — Discharge Instructions (Addendum)
Your loss of taste can be caused by a variety of issues, including COVID, influenza, strep infections/pharyngitis, poor dental hygiene, reflux, and infections of the mouth and tongue. However, your exam today is reassuring. You have been tested today for COVID and influenza. You will be contacted if your test is positive. Please follow-up with your primary care provider.

## 2019-12-26 NOTE — ED Provider Notes (Signed)
[facility] EMERGENCY DEPARTMENT Provider Note   CSN: [id number] Arrival date & time: 12/26/19  1143     History Chief Complaint  Patient presents with  . loss of taste    Tara Warner is a 44 y.o. female.  Patient presents to ED for evaluation of week long history of loss of taste. She is fully immunized for COVID. No fevers, chills, sore throat, runny nose, chest pain, shortness of breath, body aches, abdominal pain, nausea, vomiting, or diarrhea. She has not been drinking or eating much due to the lack of taste.  The history is provided by the patient. No language interpreter was used.       Past Medical History:  Diagnosis Date  . No pertinent past medical history     There are no problems to display for this patient.   Past Surgical History:  Procedure Laterality Date  . NO PAST SURGERIES       OB History    Gravida  1   Para  1   Term  1   Preterm  0   AB  0   Living  1     SAB  0   TAB  0   Ectopic  0   Multiple  0   Live Births  1           Family History  Problem Relation Age of Onset  . Other Neg Hx     Social History   Tobacco Use  . Smoking status: Current Every Day Smoker    Packs/day: 1.00    Years: 15.00    Pack years: 15.00    Types: Cigarettes  . Smokeless tobacco: Never Used  Substance Use Topics  . Alcohol use: Yes  . Drug use: No    Home Medications Prior to Admission medications   Medication Sig Start Date End Date Taking? Authorizing Provider  cephALEXin (KEFLEX) 500 MG capsule 2 caps po bid x 7 days 01/05/18   Fayrene Helper, PA-C  ibuprofen (ADVIL,MOTRIN) 100 MG/5ML suspension Take 200 mg by mouth every 4 (four) hours as needed for mild pain.    [provider]  Ibuprofen-diphenhydrAMINE Cit (ADVIL PM) 200-38 MG TABS Take 2 tablets by mouth 2 (two) times daily as needed (pain).    [provider]  polyethylene glycol (MIRALAX / GLYCOLAX) packet Take 17 g by mouth  daily. 01/05/18   Fayrene Helper, PA-C    Allergies    Patient has no known allergies.  Review of Systems   Review of Systems  Constitutional: Negative for chills, fatigue and fever.  HENT:       Loss of taste  Respiratory: Negative for cough and shortness of breath.   Gastrointestinal: Negative for abdominal pain, nausea and vomiting.  Musculoskeletal: Negative for myalgias.  Neurological: Negative for headaches.  All other systems reviewed and are negative.   Physical Exam Updated Vital Signs BP (!) 148/97   Pulse 79   Temp 98.6 F (37 C) (Oral)   Resp 16   SpO2 99%   Physical Exam Vitals and nursing note reviewed.  Constitutional:      Appearance: Normal appearance. She is not ill-appearing.  HENT:     Head: Normocephalic.     Nose: Nose normal.     Mouth/Throat:     Mouth: Mucous membranes are moist.  Eyes:     Conjunctiva/sclera: Conjunctivae normal.  Cardiovascular:     Rate and Rhythm: Normal rate.  Pulmonary:     Effort: Pulmonary effort is normal.     Breath sounds: Normal breath sounds.  Abdominal:     Palpations: Abdomen is soft.  Musculoskeletal:        General: Normal range of motion.     Cervical back: Neck supple.  Skin:    General: Skin is warm and dry.  Neurological:     Mental Status: She is alert and oriented to person, place, and time.  Psychiatric:        Mood and Affect: Mood normal.        Behavior: Behavior normal.     ED Results / Procedures / Treatments   Labs (all labs ordered are listed, but only abnormal results are displayed) Labs Reviewed  RESPIRATORY PANEL BY RT PCR (FLU A&B, COVID) - Abnormal; Notable for the following components:      Result Value   SARS Coronavirus 2 by RT PCR POSITIVE (*)    All other components within normal limits    EKG None  Radiology No results found.  Procedures Procedures (including critical care time)  Medications Ordered in ED Medications - No data to display  ED Course  I have  reviewed the triage vital signs and the nursing notes.  Pertinent labs & imaging results that were available during my care of the patient were reviewed by me and considered in my medical decision making (see chart for details).    MDM Rules/Calculators/A&P                          COVID testing ordered for patient, but she did not want to wait on results. Other than loss of taste, patient is otherwise asymptomatic and in no distress. She is able to tolerate po intake. Exam is reassuring.  COVID testing resulted after discharge. Patient is positive for COVID. Final Clinical Impression(s) / ED Diagnoses Final diagnoses:  Dysgeusia    Rx / DC Orders ED Discharge Orders    None       Felicie Morn, NP 12/26/19 2004    Melene Plan, DO 12/29/19 1503

## 2019-12-26 NOTE — ED Triage Notes (Signed)
Patient reports loss of taste since last Wednesday, denies any other symptoms or recent sick contacts.

## 2019-12-27 ENCOUNTER — Telehealth: Payer: Self-pay | Admitting: Physician Assistant

## 2019-12-27 ENCOUNTER — Encounter: Payer: Self-pay | Admitting: Physician Assistant

## 2019-12-27 ENCOUNTER — Telehealth: Payer: Self-pay | Admitting: Nurse Practitioner

## 2019-12-27 DIAGNOSIS — Z720 Tobacco use: Secondary | ICD-10-CM | POA: Insufficient documentation

## 2019-12-27 DIAGNOSIS — Z6833 Body mass index (BMI) 33.0-33.9, adult: Secondary | ICD-10-CM | POA: Insufficient documentation

## 2019-12-27 NOTE — Telephone Encounter (Signed)
Called to discuss with patient about Covid symptoms and the use of bamlanivimab/etesevimab or casirivimab/imdevimab, a monoclonal antibody infusion for those with mild to moderate Covid symptoms and at a high risk of hospitalization.  Pt is qualified for this infusion at the [facility] center due to; Specific high risk criteria : BMI > 25, Chronic Lung Disease and Other high risk medical condition per CDC:  high social vulnerability index  Was unable to leave a VM at listed phone number. I did leave he a text message with some information.   Cline Crock PA-C  [facility]

## 2019-12-27 NOTE — Telephone Encounter (Signed)
Called to Discuss with patient about Covid symptoms and the use of the monoclonal antibody infusion for those with mild to moderate Covid symptoms and at a high risk of hospitalization.     Pt appears to qualify for this infusion due to co-morbid conditions and/or a member of an at-risk group in accordance with the FDA Emergency Use Authorization.    Unable to reach pt. Voicemail left.   Nikki Pickenpack-Cousar, NP [facility]  [phone]    

## 2020-04-20 IMAGING — CR DG ABDOMEN ACUTE W/ 1V CHEST
3 series · 3 of 3 positions shown · non-contrast
Comparison: CT 10/11/2012

CLINICAL DATA: Diarrhea and rectal pain

EXAM:
DG ABDOMEN ACUTE W/ 1V CHEST

[w chest pa]
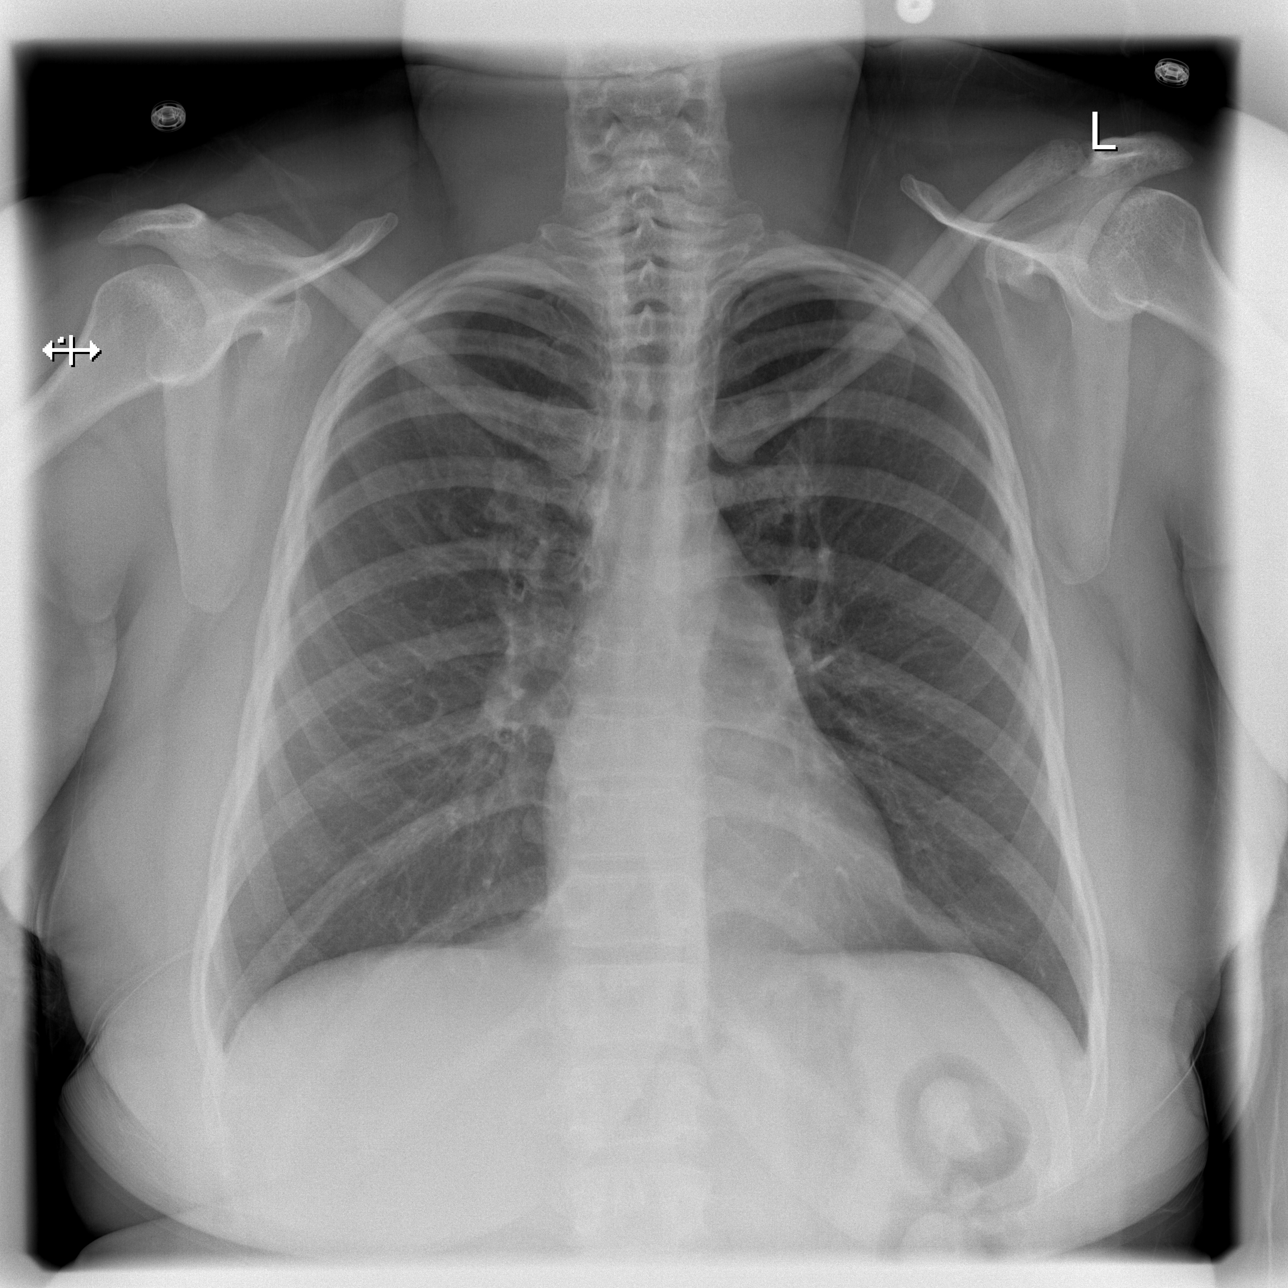

[w abdomen upright]
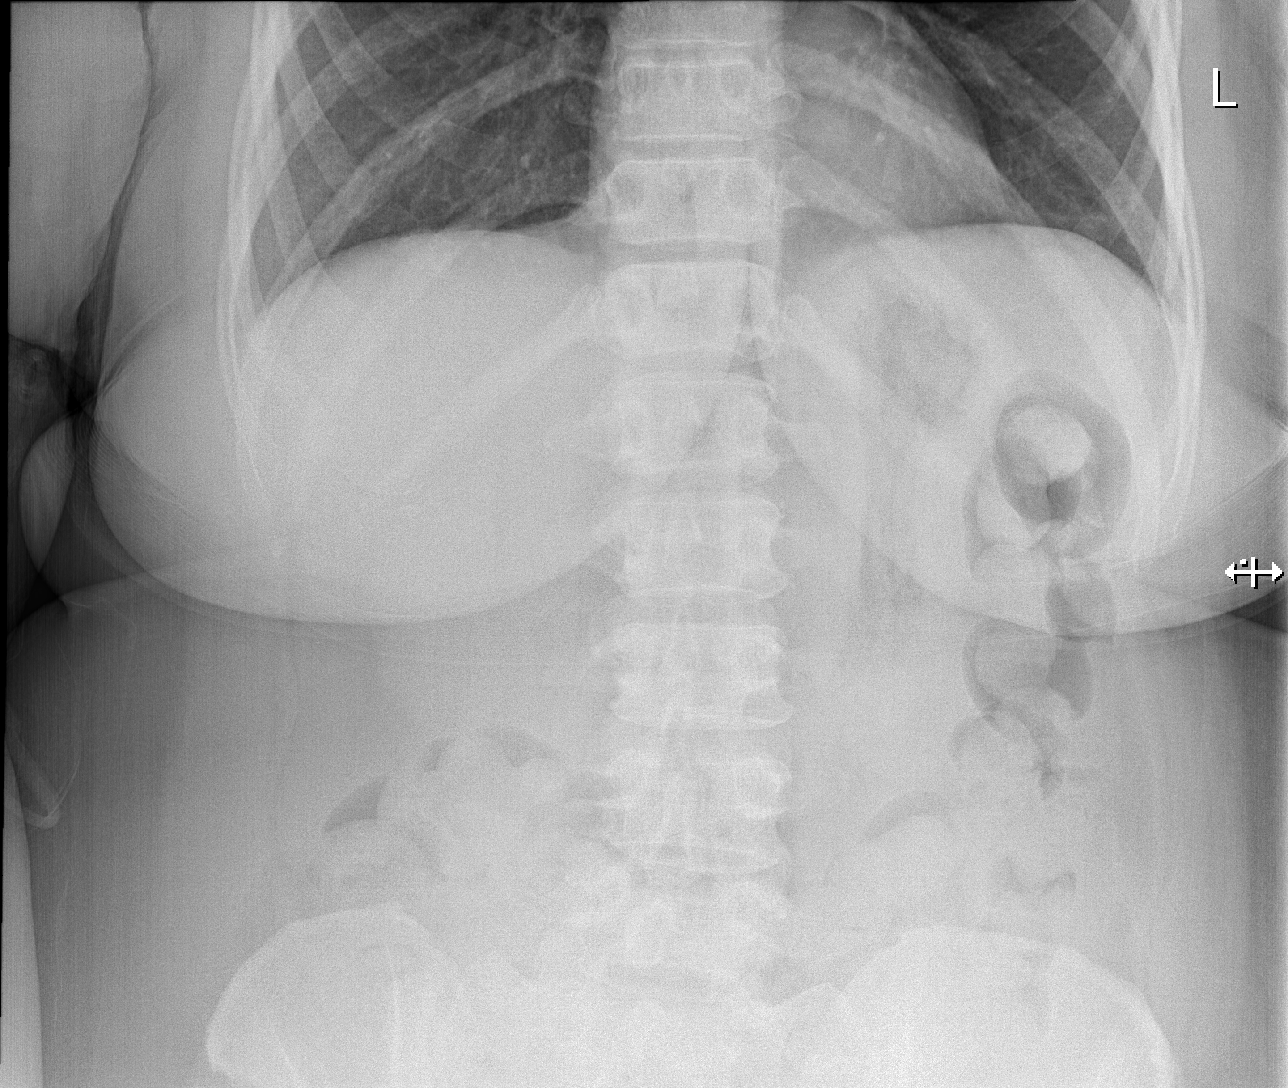

[t abdomen supine]
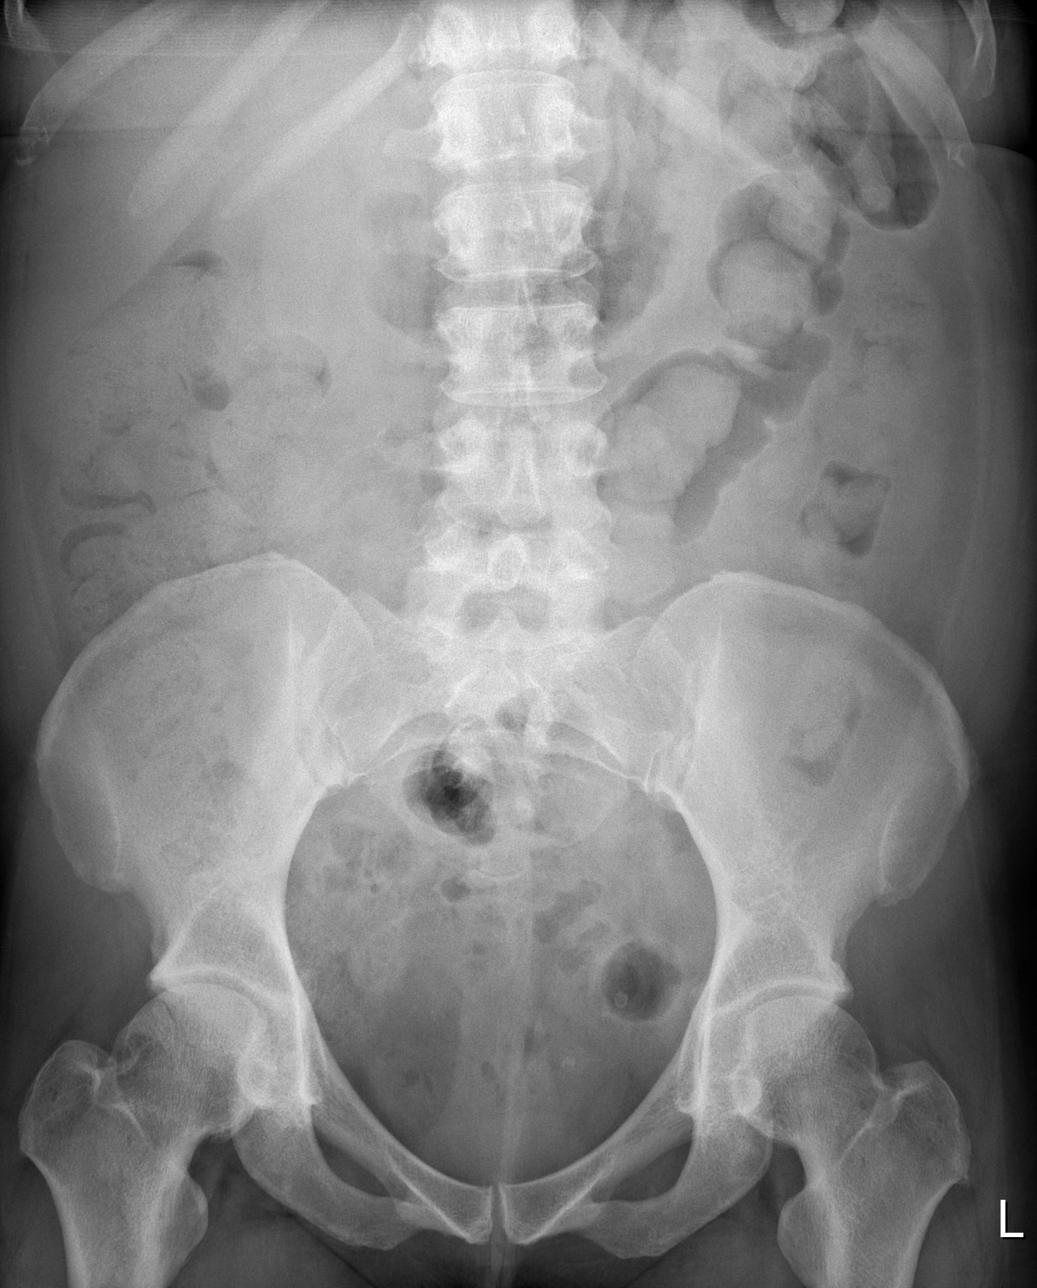

[3 of 3 positions shown; findings below may reference images not displayed]

FINDINGS: Single-view chest demonstrates no focal opacity or pleural effusion.
Normal heart size. No pneumothorax.

Supine and upright views of the abdomen demonstrate no free air
beneath the diaphragm. Nonobstructed gas pattern with large amount
of stool in the colon. No radiopaque calculi.
IMPRESSION: Negative abdominal radiographs. No acute cardiopulmonary disease.
Large volume of stool in the colon.
